# Patient Record
Sex: Female | Born: 1995 | Race: White | Hispanic: No | Marital: Single | Smoking: Never smoker
Health system: Southern US, Community
[De-identification: ages and names within clinical notes are randomized; demographics above are authoritative.]

## PROBLEM LIST (undated history)

## (undated) DIAGNOSIS — Z789 Other specified health status: Secondary | ICD-10-CM

## (undated) HISTORY — PX: TONSILLECTOMY: SUR1361

---
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
## 2017-01-02 IMAGING — US US ABDOMEN LIMITED
1 series · 14 of 25 positions shown · non-contrast
Comparison: Abdominal film March 26, 2005

CLINICAL DATA: New painful palpable lump in the left upper quadrant
of the abdomen, duration of symptoms approximately 2 days ; possible
splenomegaly on exam

EXAM:
LIMITED ABDOMINAL ULTRASOUND

[Series 1: us abdomen limited · 0.07mm/px · 14 of 25 slices shown]
[im 1/25]
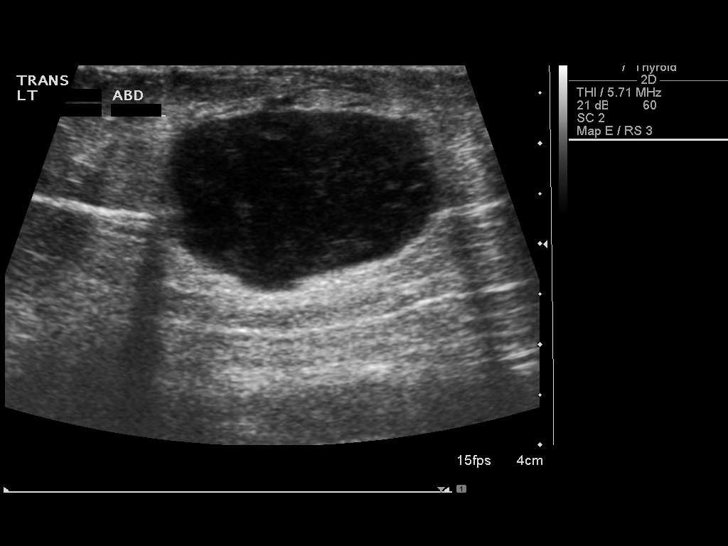
[im 3/25]
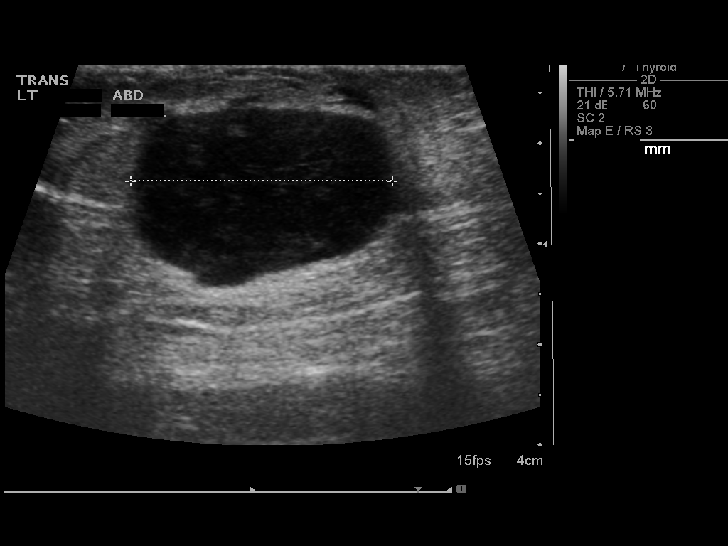
[im 5/25]
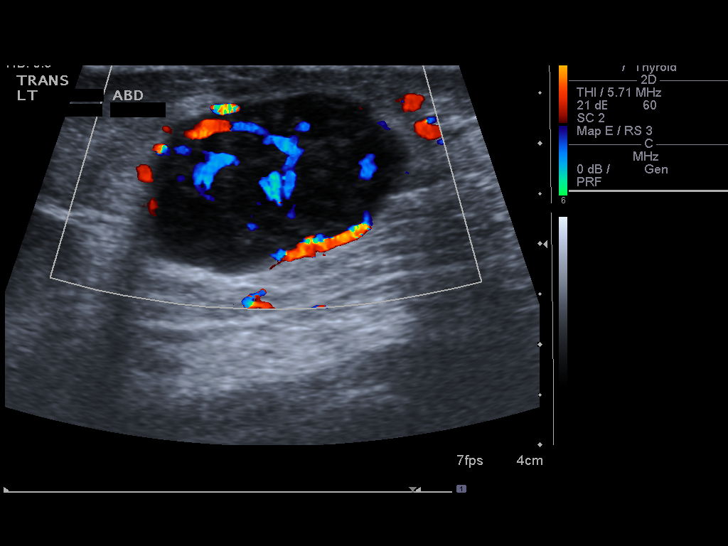
[im 7/25]
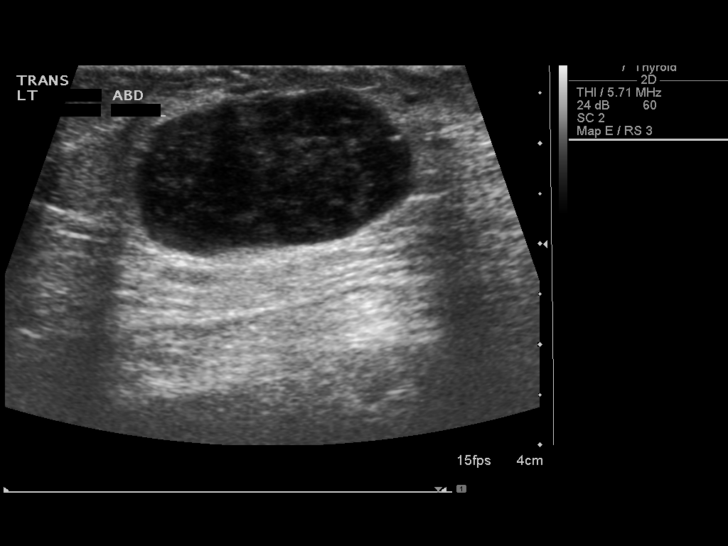
[im 9/25]
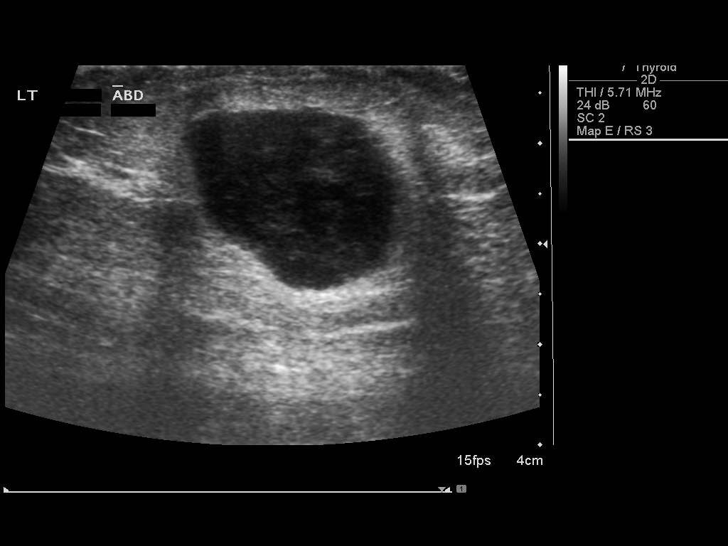
[im 10/25]
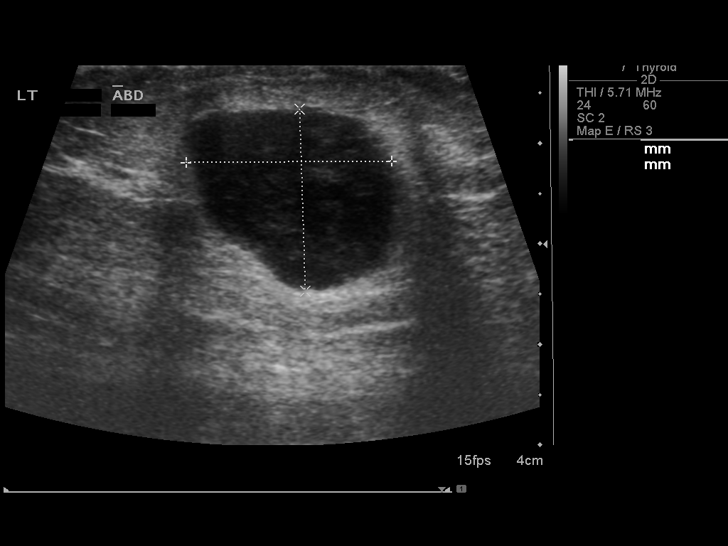
[im 12/25]
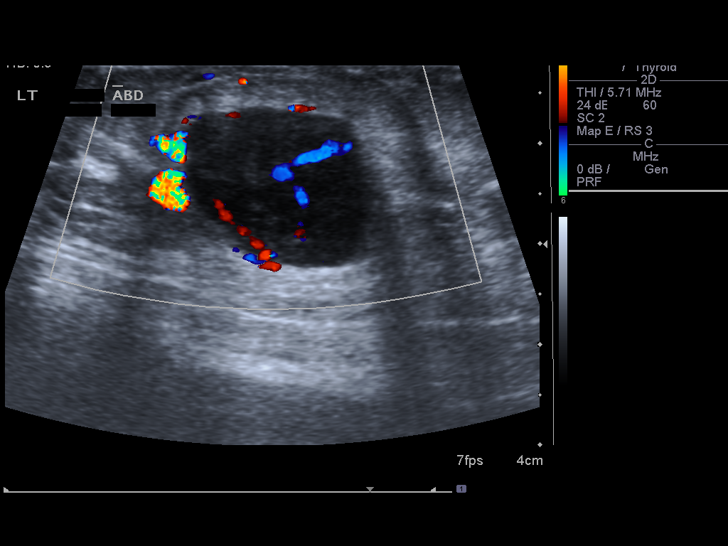
[im 14/25]
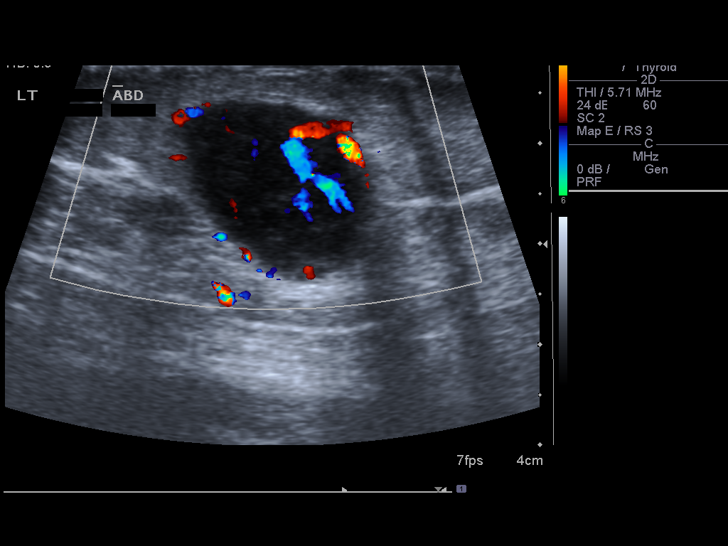
[im 16/25]
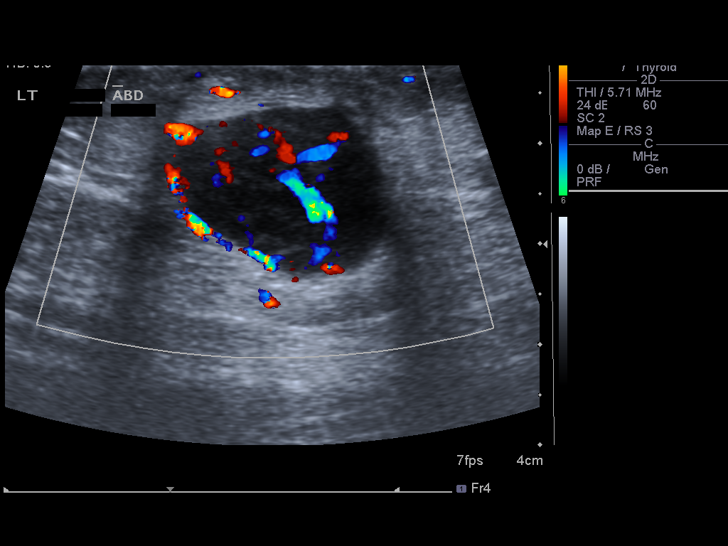
[im 17/25]
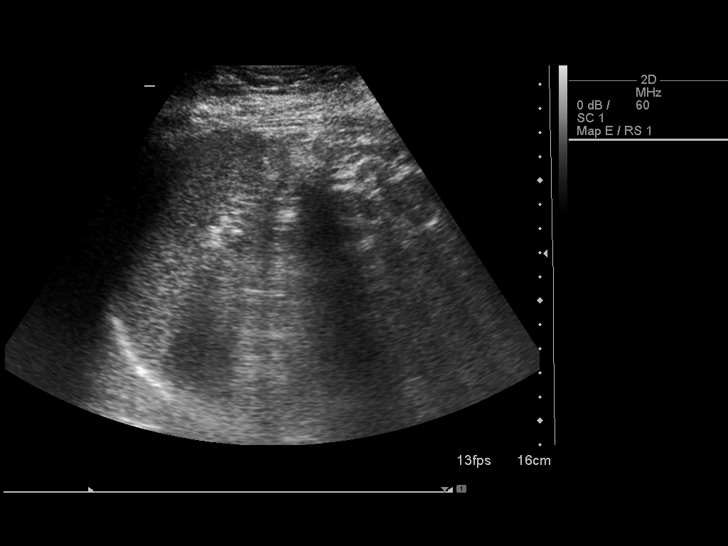
[im 19/25]
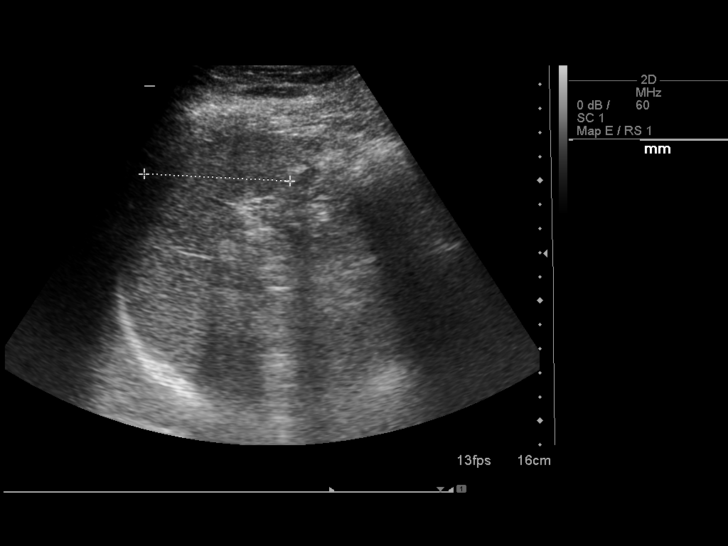
[im 21/25]
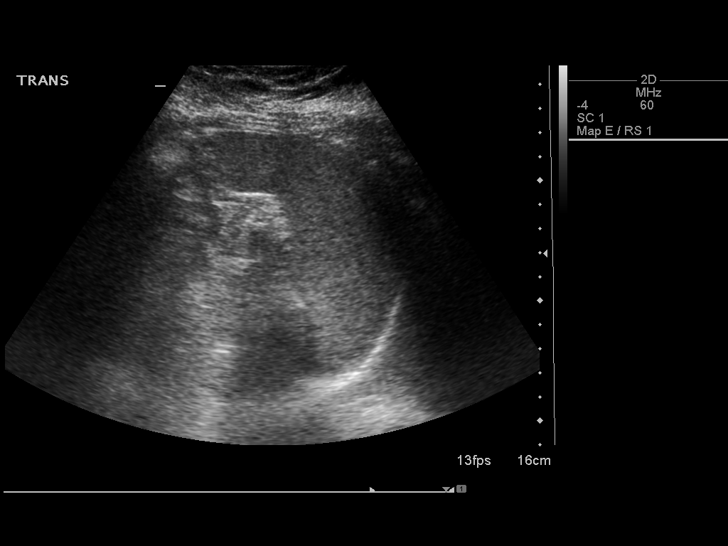
[im 23/25]
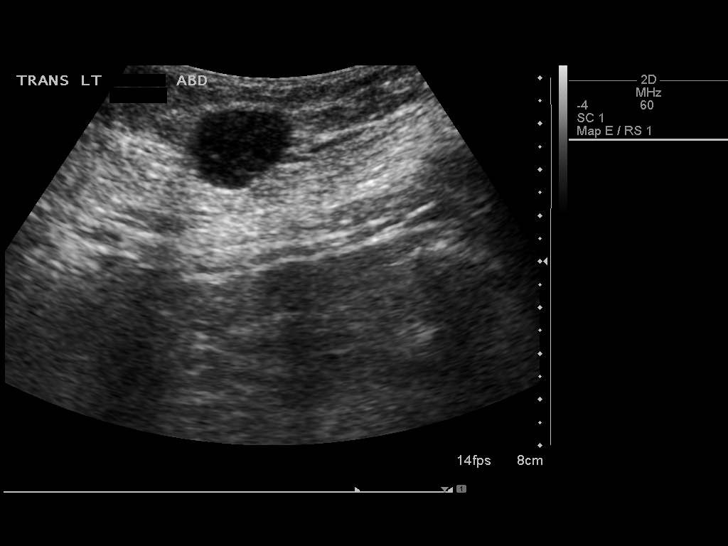
[im 25/25]
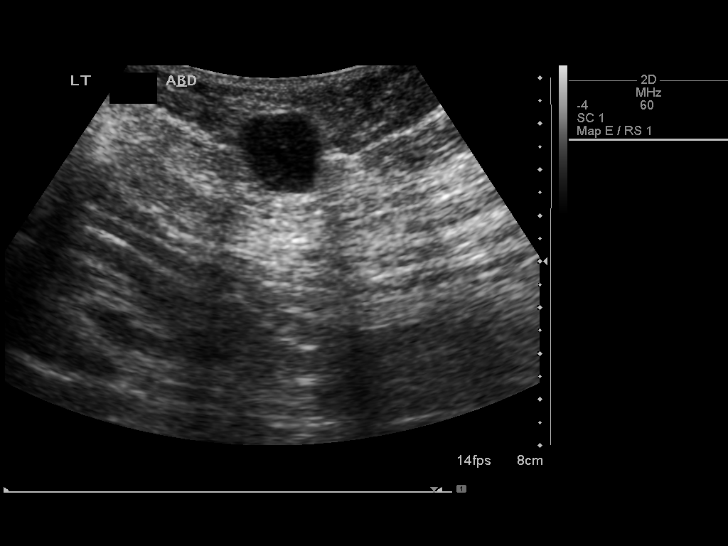

[14 of 25 positions shown; findings below may reference images not displayed]

FINDINGS: In the left upper abdomen there is a hypoechoic subcutaneous nodule
measuring 2 x 1.8 x 2.6 cm. There are low-level internal echoes and
there is vascularity internally.

The spleen is normal in echotexture and measures 6.1 cm in length.
IMPRESSION: 1. Mildly hypervascular a hypoechoic subcutaneous mass. This may
reflect an inflamed lymph node or other inflammatory process.
Surgical consultation may be indicated.
2. Normal appearance of the spleen.

## 2022-06-25 ENCOUNTER — Telehealth: Payer: Self-pay | Admitting: Pediatrics

## 2022-06-25 NOTE — Telephone Encounter (Signed)
Opened in error

## 2024-04-29 ENCOUNTER — Ambulatory Visit: Admitting: "Endocrinology

## 2024-08-04 ENCOUNTER — Ambulatory Visit: Admitting: "Endocrinology
